# Patient Record
Sex: Male | Born: 1985 | Race: White | Hispanic: No | Marital: Married | Smoking: Never smoker
Health system: Southern US, Community
[De-identification: ages and names within clinical notes are randomized; demographics above are authoritative.]

## PROBLEM LIST (undated history)

## (undated) HISTORY — PX: EYE SURGERY: SHX253

## (undated) HISTORY — PX: WISDOM TOOTH EXTRACTION: SHX21

---

## 2016-02-25 ENCOUNTER — Emergency Department (HOSPITAL_COMMUNITY): Payer: 59

## 2016-02-25 ENCOUNTER — Encounter (HOSPITAL_COMMUNITY): Payer: Self-pay | Admitting: Vascular Surgery

## 2016-02-25 ENCOUNTER — Emergency Department (HOSPITAL_COMMUNITY)
Admission: EM | Admit: 2016-02-25 | Discharge: 2016-02-25 | Disposition: A | Discharge status: Home / Self Care | Payer: 59 | Attending: Emergency Medicine | Admitting: Emergency Medicine

## 2016-02-25 DIAGNOSIS — R748 Abnormal levels of other serum enzymes: Secondary | ICD-10-CM | POA: Insufficient documentation

## 2016-02-25 DIAGNOSIS — R3129 Other microscopic hematuria: Secondary | ICD-10-CM | POA: Diagnosis not present

## 2016-02-25 DIAGNOSIS — R7989 Other specified abnormal findings of blood chemistry: Secondary | ICD-10-CM

## 2016-02-25 DIAGNOSIS — N200 Calculus of kidney: Secondary | ICD-10-CM | POA: Insufficient documentation

## 2016-02-25 DIAGNOSIS — R1032 Left lower quadrant pain: Secondary | ICD-10-CM | POA: Diagnosis present

## 2016-02-25 DIAGNOSIS — R109 Unspecified abdominal pain: Secondary | ICD-10-CM

## 2016-02-25 LAB — CBC
HCT: 47.9 % (ref 39.0–52.0)
HEMOGLOBIN: 16.2 g/dL (ref 13.0–17.0)
MCH: 29.9 pg (ref 26.0–34.0)
MCHC: 33.8 g/dL (ref 30.0–36.0)
MCV: 88.5 fL (ref 78.0–100.0)
Platelets: 278 10*3/uL (ref 150–400)
RBC: 5.41 MIL/uL (ref 4.22–5.81)
RDW: 14 % (ref 11.5–15.5)
WBC: 9.1 10*3/uL (ref 4.0–10.5)

## 2016-02-25 LAB — URINALYSIS, ROUTINE W REFLEX MICROSCOPIC
Bilirubin Urine: NEGATIVE
Glucose, UA: NEGATIVE mg/dL
KETONES UR: NEGATIVE mg/dL
Leukocytes, UA: NEGATIVE
Nitrite: NEGATIVE
PROTEIN: 100 mg/dL — AB
SQUAMOUS EPITHELIAL / LPF: NONE SEEN
Specific Gravity, Urine: 1.03 (ref 1.005–1.030)
pH: 6 (ref 5.0–8.0)

## 2016-02-25 LAB — COMPREHENSIVE METABOLIC PANEL
ALT: 31 U/L (ref 17–63)
ANION GAP: 9 (ref 5–15)
AST: 28 U/L (ref 15–41)
Albumin: 4.5 g/dL (ref 3.5–5.0)
Alkaline Phosphatase: 62 U/L (ref 38–126)
BUN: 11 mg/dL (ref 6–20)
CHLORIDE: 103 mmol/L (ref 101–111)
CO2: 27 mmol/L (ref 22–32)
CREATININE: 1.28 mg/dL — AB (ref 0.61–1.24)
Calcium: 10.1 mg/dL (ref 8.9–10.3)
GFR calc non Af Amer: >60 mL/min (ref >60)
Glucose, Bld: 124 mg/dL — ABNORMAL HIGH (ref 65–99)
Potassium: 4.6 mmol/L (ref 3.5–5.1)
Sodium: 139 mmol/L (ref 135–145)
Total Bilirubin: 0.5 mg/dL (ref 0.3–1.2)
Total Protein: 8.1 g/dL (ref 6.5–8.1)

## 2016-02-25 LAB — LIPASE, BLOOD: LIPASE: 25 U/L (ref 11–51)

## 2016-02-25 MED ORDER — NAPROXEN 500 MG PO TABS
500.0000 mg | ORAL_TABLET | Freq: Two times a day (BID) | ORAL | 0 refills | Status: AC | PRN
Start: 2016-02-25 — End: ?

## 2016-02-25 MED ORDER — OXYCODONE-ACETAMINOPHEN 5-325 MG PO TABS: 1.0000 | ORAL_TABLET | Freq: Four times a day (QID) | ORAL | 0 refills | Status: AC | PRN

## 2016-02-25 MED ORDER — ONDANSETRON 4 MG PO TBDP: 4.0000 mg | ORAL_TABLET | Freq: Three times a day (TID) | ORAL | 0 refills | Status: AC | PRN

## 2016-02-25 MED ORDER — OXYCODONE-ACETAMINOPHEN 5-325 MG PO TABS
1.0000 | ORAL_TABLET | ORAL | Status: DC | PRN
  Administered 2016-02-25: 1 via ORAL

## 2016-02-25 MED ORDER — SODIUM CHLORIDE 0.9 % IV BOLUS (SEPSIS)
1000.0000 mL | Freq: Once | INTRAVENOUS | Status: AC
  Administered 2016-02-25: 1000 mL via INTRAVENOUS

## 2016-02-25 MED ORDER — OXYCODONE-ACETAMINOPHEN 5-325 MG PO TABS
ORAL_TABLET | ORAL | Status: AC
  Filled 2016-02-25: qty 1

## 2016-02-25 MED ORDER — TAMSULOSIN HCL 0.4 MG PO CAPS: 0.4000 mg | ORAL_CAPSULE | Freq: Every day | ORAL | 0 refills | Status: AC

## 2016-02-25 NOTE — ED Notes (Signed)
Ambulated pt from waiting room to E39, pt transported to CT.

## 2016-02-25 NOTE — ED Provider Notes (Signed)
[facility]-EMERGENCY DEPT Provider Note   CSN: [id number] Arrival date & time: 02/25/16  1149     History   Chief Complaint Chief Complaint  Patient presents with  . Flank Pain    HPI Casey Phillips is a 30 y.o. Male with a PMHx of low testosterone, who presents to the ED with complaints of sudden onset left flank pain that began at 9 AM (7hrs prior to exam) while he was at work. He describes his pain as 8/10 constant sharp pain radiating from the left flank into the left lower quadrant and suprapubic area, worse with sitting and urination, unrelieved with Tums, and significantly improved with Percocet given in triage. Associated symptoms include pain exacerbated with urination but no burning dysuria, and increased urinary frequency and urgency.   He states that he has frequent GI issues, and is working with his PCP to find out the cause, has no diagnosis yet, but states that today's episode of pain is very different from the GI issues he's had in the past. He recalls a time recently that he had some mild L flank pain but didn't think anything of it and had no other symptoms with it, and it self resolved so he never went to have an evaluation of it. He's had some intermittent ventral penile pain in the past several months, but none today, and is under the care of Dr. Patsi Searsannenbaum for that. Denies that today's symptoms radiate to the penis/testicles. +FHx of nephrolithiasis in his father (just diagnosed recently), but no personal hx of nephrolithiasis. Denies recent sick contacts, travel, suspicious food, prior abd surgeries, or frequent NSAID use. Admits to occasional EtOH use, 1-2x/week, last had 2 beers last night.   He denies fevers, chills, CP, SOB, N/V/D/C, obstipation, melena, hematochezia, testicular pain/swelling, penile pain today, penile discharge, rectal pain, hematuria, myalgias, arthralgias, numbness, tingling, focal weakness, or any other complaints at this time.   The history is provided  by the patient. No language interpreter was used.  Flank Pain  This is a new problem. The current episode started 6 to 12 hours ago. The problem occurs constantly. The problem has been gradually improving (since percocet given in triage). Associated symptoms include abdominal pain (radiating from flank). Pertinent negatives include no chest pain and no shortness of breath. Exacerbated by: urination and sitting. The symptoms are relieved by narcotics (percocet in triage). Treatments tried: percocet in triage. The treatment provided significant relief.    History reviewed. No pertinent past medical history.  There are no active problems to display for this patient.   Past Surgical History:  Procedure Laterality Date  . EYE SURGERY    . WISDOM TOOTH EXTRACTION         Home Medications    Prior to Admission medications   Not on File    Family History No family history on file.  Social History Social History  Substance Use Topics  . Smoking status: Never Smoker  . Smokeless tobacco: Never Used  . Alcohol use Yes     Comment: 1-2/week     Allergies   Patient has no allergy information on record.   Review of Systems Review of Systems  Constitutional: Negative for chills and fever.  Respiratory: Negative for shortness of breath.   Cardiovascular: Negative for chest pain.  Gastrointestinal: Positive for abdominal pain (radiating from flank). Negative for blood in stool, constipation, diarrhea, nausea, rectal pain and vomiting.  Genitourinary: Positive for dysuria (not burning, just makes flank pain worse), flank pain,  frequency and urgency. Negative for discharge, hematuria, penile pain, scrotal swelling and testicular pain.  Musculoskeletal: Negative for arthralgias and myalgias.  Skin: Negative for color change.  Allergic/Immunologic: Negative for immunocompromised state.  Neurological: Negative for weakness and numbness.  Psychiatric/Behavioral: Negative for confusion.     10 Systems reviewed and are negative for acute change except as noted in the HPI.   Physical Exam Updated Vital Signs BP 128/74 (BP Location: Right Arm)   Pulse 81   Temp 97.5 F (36.4 C) (Oral)   Resp 18   SpO2 98%   Physical Exam  Constitutional: He is oriented to person, place, and time. Vital signs are normal. He appears well-developed and well-nourished.  Non-toxic appearance. No distress.  Afebrile, nontoxic, NAD  HENT:  Head: Normocephalic and atraumatic.  Mouth/Throat: Oropharynx is clear and moist and mucous membranes are normal.  Eyes: Conjunctivae and EOM are normal. Right eye exhibits no discharge. Left eye exhibits no discharge.  Neck: Normal range of motion. Neck supple.  Cardiovascular: Normal rate, regular rhythm, normal heart sounds and intact distal pulses.  Exam reveals no gallop and no friction rub.   No murmur heard. Pulmonary/Chest: Effort normal and breath sounds normal. No respiratory distress. He has no decreased breath sounds. He has no wheezes. He has no rhonchi. He has no rales.  Abdominal: Soft. Normal appearance and bowel sounds are normal. He exhibits no distension. There is tenderness in the suprapubic area and left lower quadrant. There is CVA tenderness. There is no rigidity, no rebound, no guarding, no tenderness at McBurney's point and negative Murphy's sign.    Soft, obese but nondistended, +BS throughout, with mild LLQ/suprapubic TTP, no r/g/r, neg murphy's, neg mcburney's, and mild L sided CVA TTP   Musculoskeletal: Normal range of motion.  Neurological: He is alert and oriented to person, place, and time. He has normal strength. No sensory deficit.  Skin: Skin is warm, dry and intact. No rash noted.  Psychiatric: He has a normal mood and affect.  Nursing note and vitals reviewed.    ED Treatments / Results  Labs (all labs ordered are listed, but only abnormal results are displayed) Labs Reviewed  COMPREHENSIVE METABOLIC PANEL -  Abnormal; Notable for the following:       Result Value   Glucose, Bld 124 (*)    Creatinine, Ser 1.28 (*)    All other components within normal limits  URINALYSIS, ROUTINE W REFLEX MICROSCOPIC - Abnormal; Notable for the following:    APPearance HAZY (*)    Hgb urine dipstick LARGE (*)    Protein, ur 100 (*)    Bacteria, UA RARE (*)    All other components within normal limits  URINE CULTURE  LIPASE, BLOOD  CBC    EKG  EKG Interpretation None       Radiology Ct Renal Stone Study  Result Date: 02/25/2016 CLINICAL DATA:  Left sided flank pain for 1 day EXAM: CT ABDOMEN AND PELVIS WITHOUT CONTRAST TECHNIQUE: Multidetector CT imaging of the abdomen and pelvis was performed following the standard protocol without IV contrast. COMPARISON:  None. FINDINGS: Lower chest: No acute abnormality. Hepatobiliary: No focal liver abnormality is seen. No gallstones, gallbladder wall thickening, or biliary dilatation. Pancreas: Unremarkable. No pancreatic ductal dilatation or surrounding inflammatory changes. Spleen: Normal in size without focal abnormality. Adrenals/Urinary Tract: The adrenal glands are within normal limits. The right kidney is unremarkable. The left kidney demonstrates evidence of hydronephrosis and hydroureter extending to the level of the ureterovesical  junction. A 6 mm stone is noted at the left UVJ causing the obstructive change. The bladder is decompressed. Stomach/Bowel: The appendix is not well visualized. No inflammatory changes are noted. No obstructive changes are seen. Vascular/Lymphatic: No significant vascular findings are present. No enlarged abdominal or pelvic lymph nodes. Reproductive: Prostate is unremarkable. Other: No abdominal wall hernia or abnormality. No abdominopelvic ascites. Musculoskeletal: No acute or significant osseous findings. IMPRESSION: 6 mm left UVJ stone with obstructive changes. No other focal abnormality is noted. Electronically Signed   By: Alcide Clever M.D.   On: 02/25/2016 15:54    Procedures Procedures (including critical care time)  Medications Ordered in ED Medications  oxyCODONE-acetaminophen (PERCOCET/ROXICET) 5-325 MG per tablet 1 tablet (1 tablet Oral Given 02/25/16 1207)  oxyCODONE-acetaminophen (PERCOCET/ROXICET) 5-325 MG per tablet (not administered)  sodium chloride 0.9 % bolus 1,000 mL (1,000 mLs Intravenous New Bag/Given 02/25/16 1619)     Initial Impression / Assessment and Plan / ED Course  I have reviewed the triage vital signs and the nursing notes.  Pertinent labs & imaging results that were available during my care of the patient were reviewed by me and considered in my medical decision making (see chart for details).  Clinical Course     30 y.o. male here with L flank pain x7hrs radiating to suprapubic/LLQ area, increased urinary frequency/urgency, and pain with urination but not burning dysuria. +FHx of nephrolithiasis, no personal hx of such. On exam, mild suprapubic and LLQ TTP, mild L flank tenderness, nonperitoneal exam. Much more comfortable appearing compared to what was reported by triage staff, pt states percocet in triage helped significantly. Declines wanting anything else at this time for his pain. Lipase WNL, CBC WNL, CMP showing Cr 1.28 but unknown baseline since he's never been in our system before. U/A not yet obtained. CT renal study done prior to my exam resulting and showing 6mm left UVJ stone without evidence of obstruction. This is on the cusp of being passable, given no findings of obstruction at this point, he may pass it without issue/intervention. Will give fluids and await U/A, then recheck.   5:05 PM U/A with TNTC RBCs, nitrite and leuk neg, 0-5 WBC and rare bacteria, no squamous cells seen. Doubt infection, will add-on culture but doubt need for treatment, dipstick findings likely related to kidney stone. Pt continues to feel well. Will d/c home with percocet, naprosyn, flomax, zofran  (just in case), and urine strainer. Discussed staying hydrated, and f/up with his urologist in 1wk for recheck and ongoing management. Strict return precautions discussed. I explained the diagnosis and have given explicit precautions to return to the ER including for any other new or worsening symptoms. The patient understands and accepts the medical plan as it's been dictated and I have answered their questions. Discharge instructions concerning home care and prescriptions have been given. The patient is STABLE and is discharged to home in good condition.   Final Clinical Impressions(s) / ED Diagnoses   Final diagnoses:  Left flank pain  Nephrolithiasis  Other microscopic hematuria  Elevated serum creatinine    New Prescriptions New Prescriptions   NAPROXEN (NAPROSYN) 500 MG TABLET    Take 1 tablet (500 mg total) by mouth 2 (two) times daily as needed for mild pain, moderate pain or headache (TAKE WITH MEALS.).   ONDANSETRON (ZOFRAN ODT) 4 MG DISINTEGRATING TABLET    Take 1 tablet (4 mg total) by mouth every 8 (eight) hours as needed for nausea or vomiting.  OXYCODONE-ACETAMINOPHEN (PERCOCET) 5-325 MG TABLET    Take 1 tablet by mouth every 6 (six) hours as needed for severe pain.   TAMSULOSIN (FLOMAX) 0.4 MG CAPS CAPSULE    Take 1 capsule (0.4 mg total) by mouth daily after supper. TAKE UNTIL STONE PASSES, then stop taking it     Levi StraussMercedes Camprubi-Soms, PA-C 02/25/16 1706    Samuel JesterKathleen McManus, DO 02/28/16 1816

## 2016-02-25 NOTE — Discharge Instructions (Signed)
Take naprosyn as directed as needed for pain using percocet for breakthrough pain. Do not drive or operate machinery with pain medication use. May need over-the-counter stool softener with this pain medication use. Use Zofran as needed for nausea. Use Flomax as directed, as this medication will help you pass the stone. Strain all urine to try to catch the stone when it passes. Follow-up with your urologist in the next 1 to 2 weeks for recheck of ongoing pain, however for intractable or uncontrollable pain at home [facility]

## 2016-02-25 NOTE — ED Triage Notes (Signed)
Pt reports to the ED for eval of left flank pain and LLQ abd pain that started today. Pt reports he has had dysuria x 3 days now as well. Pt denies any hematuria. States that he has had decreased urinary output. Denies any fevers, chills, or N/V. Pt visible uncomfortable and unable to sit still.

## 2016-02-25 NOTE — ED Notes (Signed)
E-signature not available, pt denies concerns with dc 

## 2016-02-26 LAB — URINE CULTURE: CULTURE: NO GROWTH

## 2016-06-24 ENCOUNTER — Ambulatory Visit: Payer: 59 | Admitting: Psychology

## 2016-06-24 ENCOUNTER — Ambulatory Visit (INDEPENDENT_AMBULATORY_CARE_PROVIDER_SITE_OTHER): Payer: 59 | Admitting: Psychology

## 2016-06-24 DIAGNOSIS — F418 Other specified anxiety disorders: Secondary | ICD-10-CM | POA: Diagnosis not present

## 2016-07-07 ENCOUNTER — Ambulatory Visit: Payer: Self-pay | Admitting: Psychology

## 2017-05-05 IMAGING — CT CT RENAL STONE PROTOCOL
2 of 4 series · 17 of 46 positions shown, 19 images · non-contrast
Comparison: None.

CLINICAL DATA: Left sided flank pain for 1 day

EXAM:
CT ABDOMEN AND PELVIS WITHOUT CONTRAST
TECHNIQUE: Multidetector CT imaging of the abdomen and pelvis was performed
following the standard protocol without IV contrast.

[Series 2: renal stone 5.0 · axial · 0.98mm/px · z∈[+756,+1271]mm · 14 of 113 slices shown, 16 images]
[im 5/113  soft-tissue]
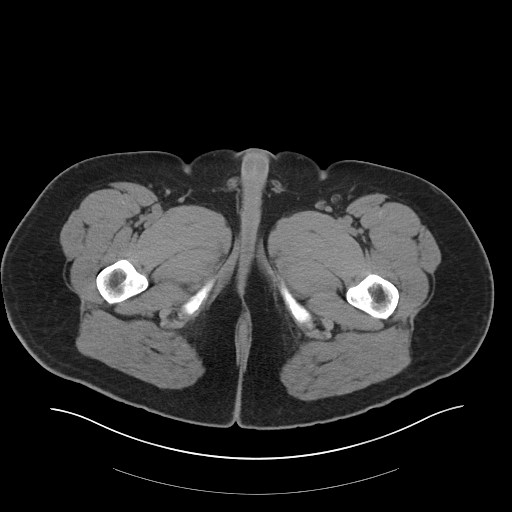
[im 5/113  bone]
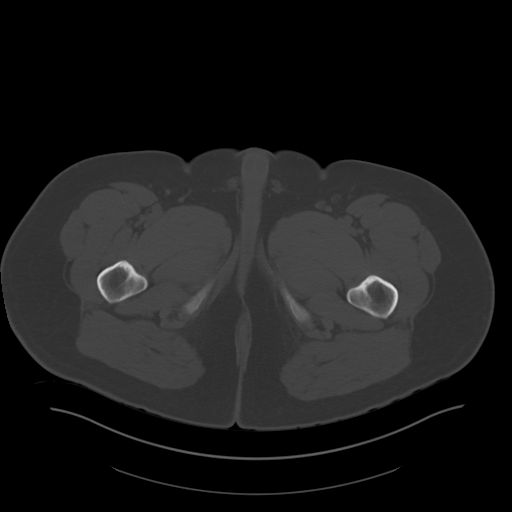
[im 15/113  soft-tissue]
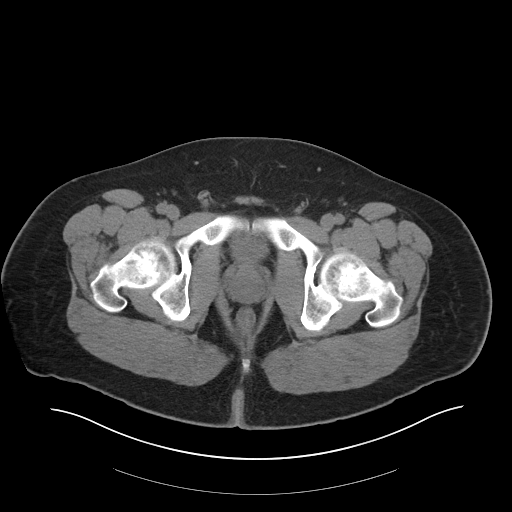
[im 24/113  soft-tissue]
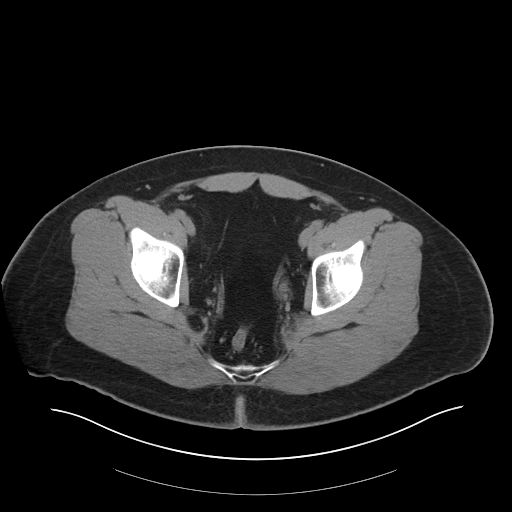
[im 29/113  soft-tissue]
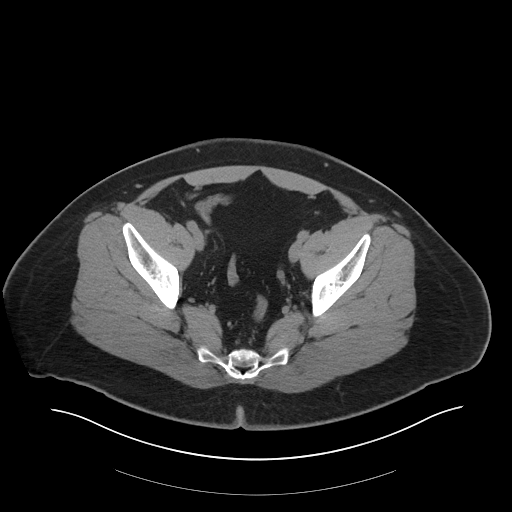
[im 38/113  soft-tissue]
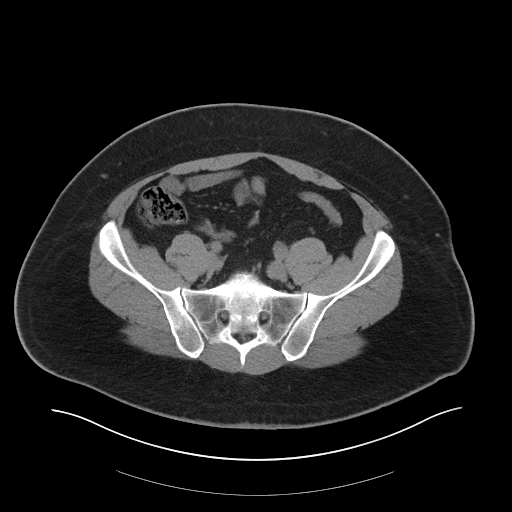
[im 47/113  soft-tissue]
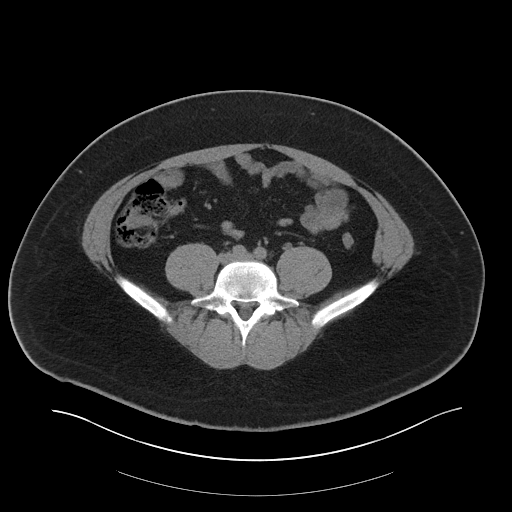
[im 52/113  soft-tissue]
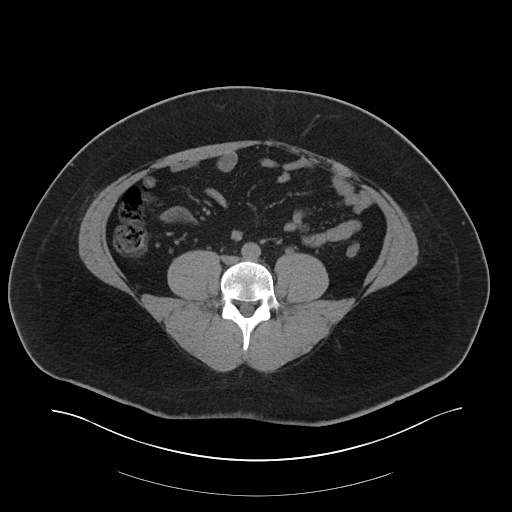
[im 61/113  soft-tissue]
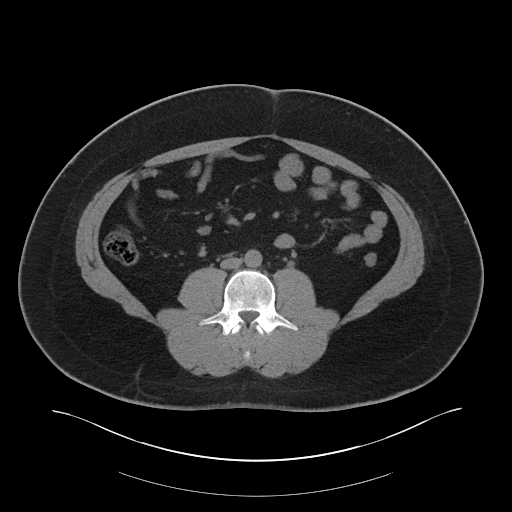
[im 66/113  soft-tissue]
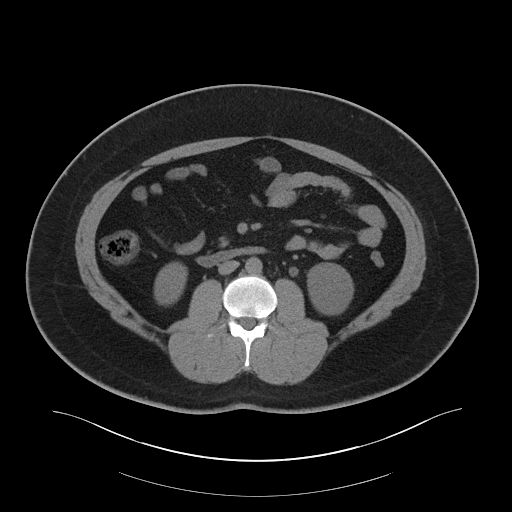
[im 66/113  bone]
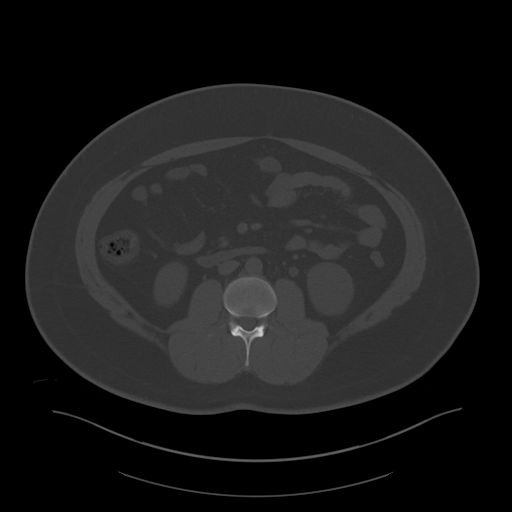
[im 75/113  soft-tissue]
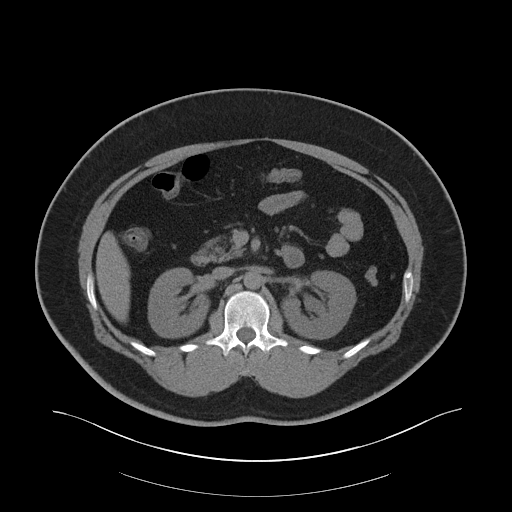
[im 85/113  soft-tissue]
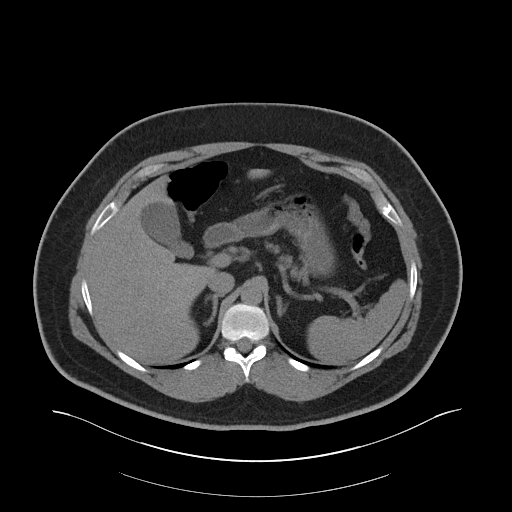
[im 89/113  soft-tissue]
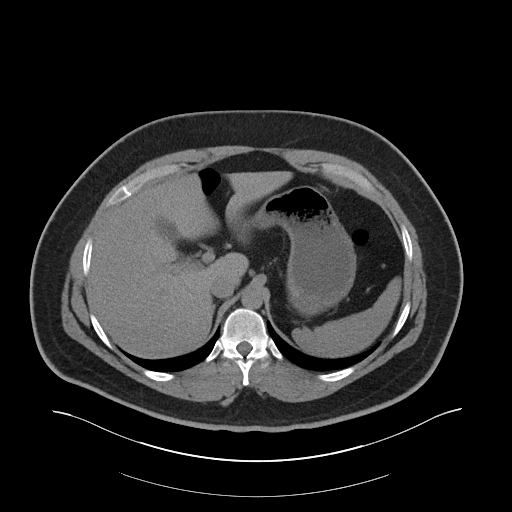
[im 99/113  soft-tissue]
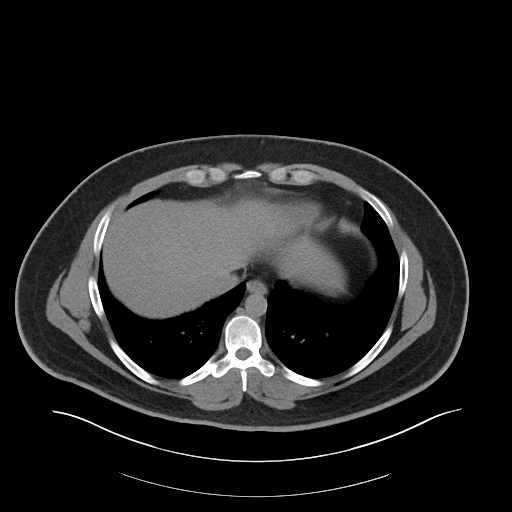
[im 108/113  soft-tissue]
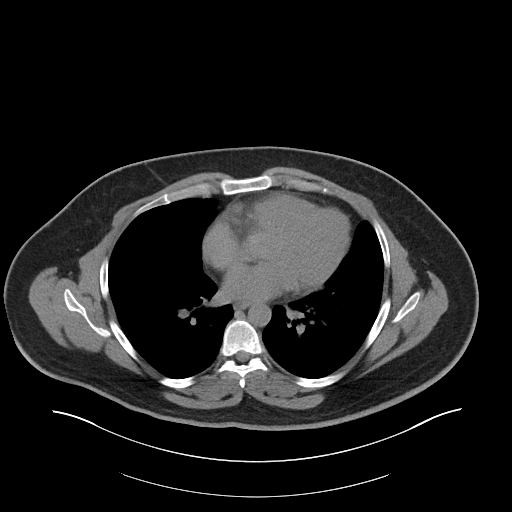

[Series 4: renal stone 3.0 cor · coronal · 0.97mm/px · 3 of 114 slices shown]
[im 38/114  soft-tissue]
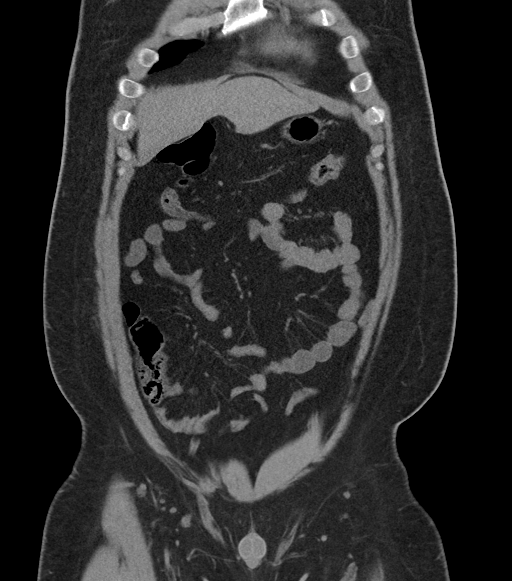
[im 51/114  soft-tissue]
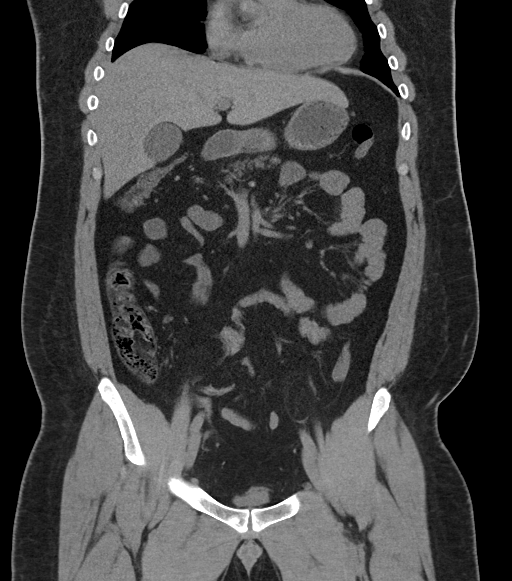
[im 63/114  soft-tissue]
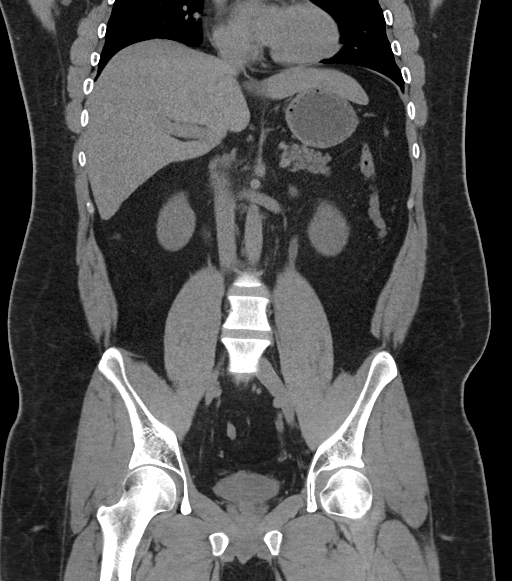

[17 of 46 positions shown; findings below may reference images not displayed]

FINDINGS: Lower chest: No acute abnormality.

Hepatobiliary: No focal liver abnormality is seen. No gallstones,
gallbladder wall thickening, or biliary dilatation.

Pancreas: Unremarkable. No pancreatic ductal dilatation or
surrounding inflammatory changes.

Spleen: Normal in size without focal abnormality.

Adrenals/Urinary Tract: The adrenal glands are within normal limits.
The right kidney is unremarkable. The left kidney demonstrates
evidence of hydronephrosis and hydroureter extending to the level of
the ureterovesical junction. A 6 mm stone is noted at the left UVJ
causing the obstructive change. The bladder is decompressed.

Stomach/Bowel: The appendix is not well visualized. No inflammatory
changes are noted. No obstructive changes are seen.

Vascular/Lymphatic: No significant vascular findings are present. No
enlarged abdominal or pelvic lymph nodes.

Reproductive: Prostate is unremarkable.

Other: No abdominal wall hernia or abnormality. No abdominopelvic
ascites.

Musculoskeletal: No acute or significant osseous findings.
IMPRESSION: 6 mm left UVJ stone with obstructive changes. No other focal
abnormality is noted.
